# Patient Record
Sex: Female | Born: 1995 | Race: Black or African American | Hispanic: No | Marital: Single | Smoking: Never smoker
Health system: Southern US, Community
[De-identification: ages and names within clinical notes are randomized; demographics above are authoritative.]

## PROBLEM LIST (undated history)

## (undated) DIAGNOSIS — K219 Gastro-esophageal reflux disease without esophagitis: Secondary | ICD-10-CM

## (undated) DIAGNOSIS — D649 Anemia, unspecified: Secondary | ICD-10-CM

---
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
## 2015-03-25 ENCOUNTER — Other Ambulatory Visit: Payer: Self-pay | Admitting: Family Medicine

## 2015-03-25 ENCOUNTER — Ambulatory Visit
Admission: RE | Admit: 2015-03-25 | Discharge: 2015-03-25 | Disposition: A | Discharge status: Home / Self Care | Payer: 59 | Source: Ambulatory Visit | Attending: Family Medicine | Admitting: Family Medicine

## 2015-03-25 DIAGNOSIS — M545 Low back pain: Secondary | ICD-10-CM

## 2015-03-25 DIAGNOSIS — M549 Dorsalgia, unspecified: Secondary | ICD-10-CM

## 2017-08-14 IMAGING — CR DG CERVICAL SPINE COMPLETE 4+V
6 series · 6 of 6 positions shown · non-contrast
Comparison: 09/18/2013.

CLINICAL DATA: Pain.  No known injury.

EXAM:
CERVICAL SPINE - COMPLETE 4+ VIEW

[w cervical spine lat]
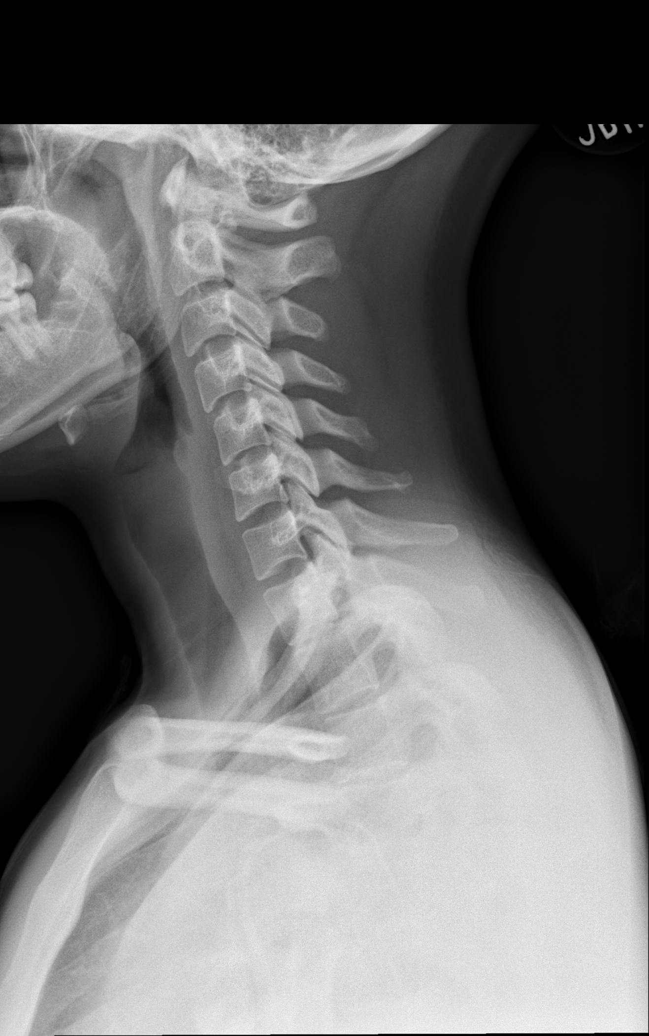

[w cervical spine ap_obl (1 of 2)]
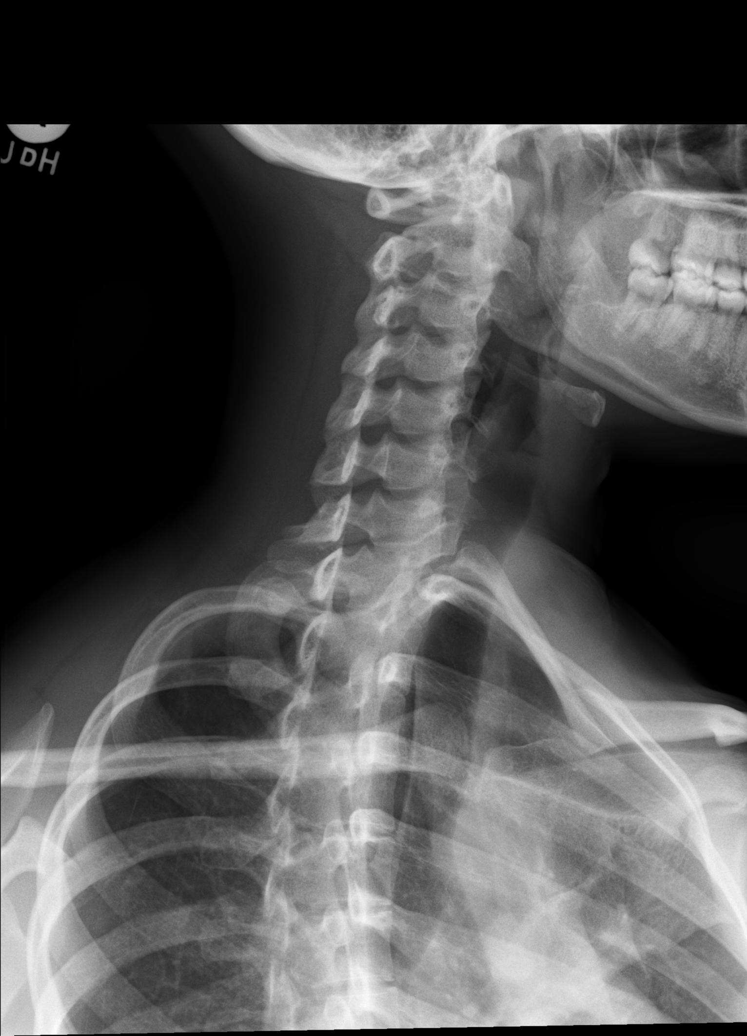

[w cervical spine ap_obl (2 of 2)]
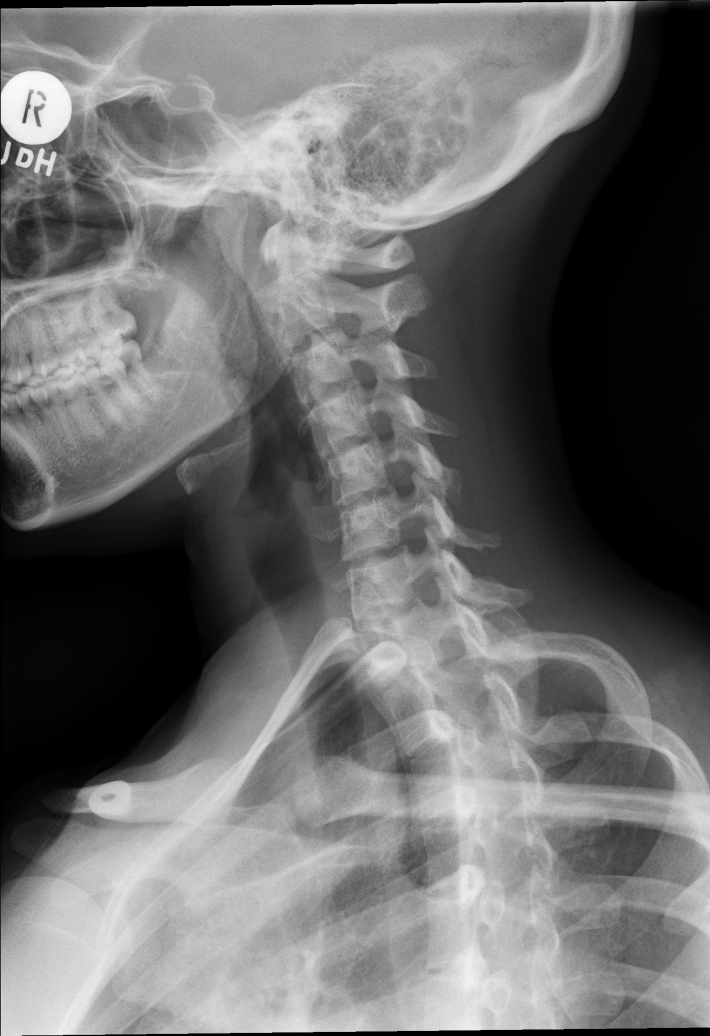

[w cervical spine ap]
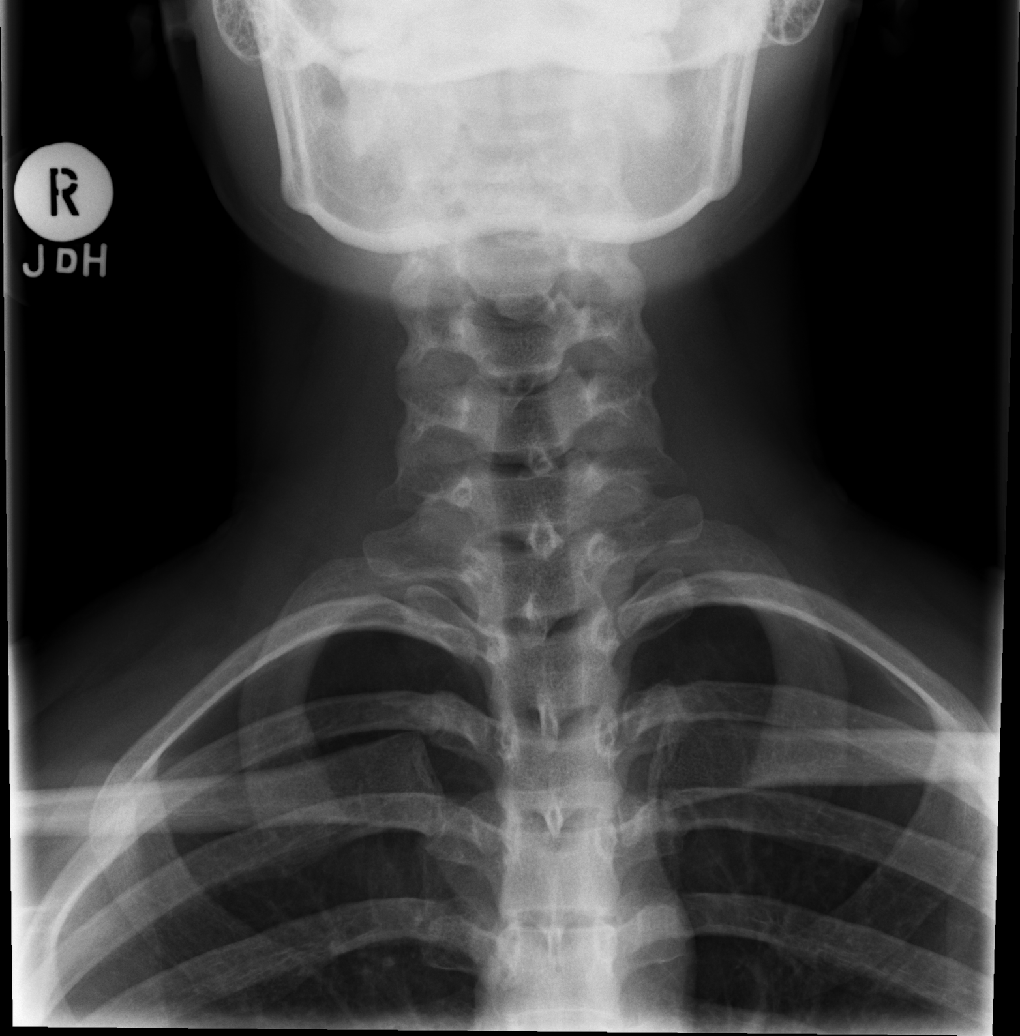

[t cervical spine odontoid (1 of 2)]
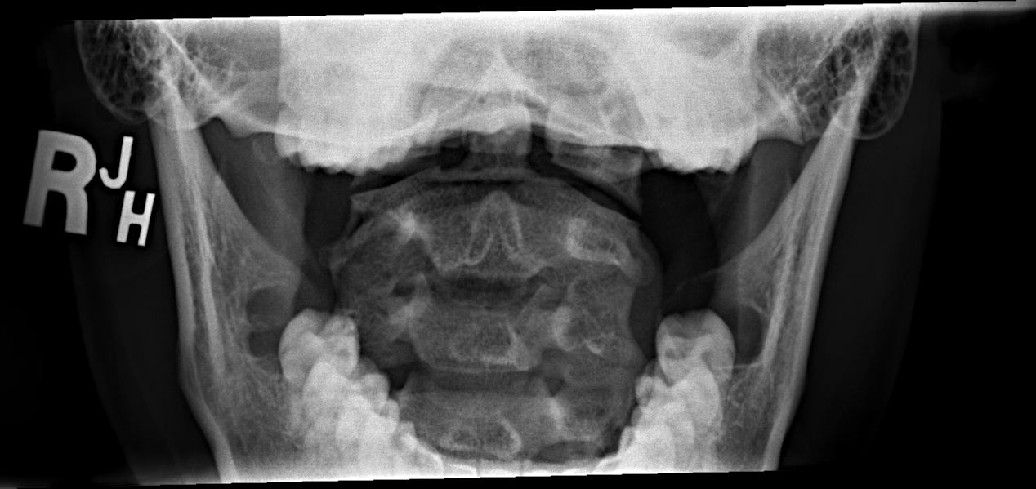

[t cervical spine odontoid (2 of 2)]
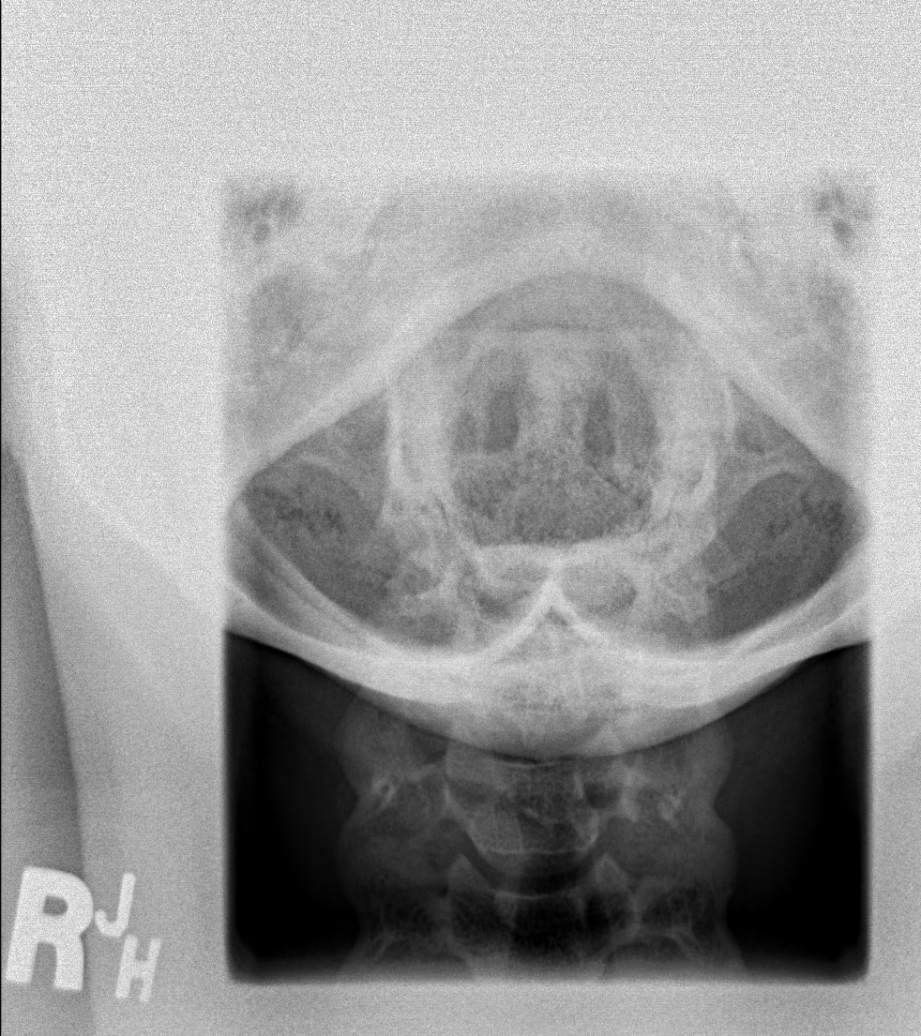

[6 of 6 positions shown; findings below may reference images not displayed]

FINDINGS: Loss of normal cervical lordosis. No evidence fracture or
dislocation. Pulmonary apices are clear .
IMPRESSION: Loss of normal cervical lordosis. This may be related to
positioning, torticollis, or ligamentous injury. No evidence
fracture or dislocation.
